# Patient Record
Sex: Female | Born: 1960 | Race: White | Hispanic: No | Marital: Married | Smoking: Never smoker
Health system: Southern US, Community
[De-identification: ages and names within clinical notes are randomized; demographics above are authoritative.]

## PROBLEM LIST (undated history)

## (undated) DIAGNOSIS — I890 Lymphedema, not elsewhere classified: Secondary | ICD-10-CM

## (undated) DIAGNOSIS — F329 Major depressive disorder, single episode, unspecified: Secondary | ICD-10-CM

## (undated) DIAGNOSIS — F419 Anxiety disorder, unspecified: Secondary | ICD-10-CM

## (undated) DIAGNOSIS — M542 Cervicalgia: Secondary | ICD-10-CM

## (undated) DIAGNOSIS — F5104 Psychophysiologic insomnia: Secondary | ICD-10-CM

## (undated) DIAGNOSIS — S0300XA Dislocation of jaw, unspecified side, initial encounter: Secondary | ICD-10-CM

## (undated) DIAGNOSIS — H409 Unspecified glaucoma: Secondary | ICD-10-CM

## (undated) DIAGNOSIS — F32A Depression, unspecified: Secondary | ICD-10-CM

## (undated) HISTORY — DX: Dislocation of jaw, unspecified side, initial encounter: S03.00XA

## (undated) HISTORY — DX: Lymphedema, not elsewhere classified: I89.0

## (undated) HISTORY — DX: Anxiety disorder, unspecified: F41.9

## (undated) HISTORY — PX: WISDOM TOOTH EXTRACTION: SHX21

## (undated) HISTORY — PX: BREAST LUMPECTOMY: SHX2

## (undated) HISTORY — DX: Cervicalgia: M54.2

## (undated) HISTORY — DX: Unspecified glaucoma: H40.9

## (undated) HISTORY — DX: Psychophysiologic insomnia: F51.04

---

## 2002-02-10 HISTORY — PX: BREAST BIOPSY: SHX20

## 2010-04-25 HISTORY — PX: MYOMECTOMY: SHX85

## 2013-11-08 DIAGNOSIS — F419 Anxiety disorder, unspecified: Secondary | ICD-10-CM

## 2013-11-08 HISTORY — DX: Anxiety disorder, unspecified: F41.9

## 2015-01-30 ENCOUNTER — Other Ambulatory Visit: Payer: Self-pay

## 2015-01-30 DIAGNOSIS — Z1231 Encounter for screening mammogram for malignant neoplasm of breast: Secondary | ICD-10-CM

## 2015-02-15 ENCOUNTER — Ambulatory Visit
Admission: RE | Admit: 2015-02-15 | Discharge: 2015-02-15 | Disposition: A | Discharge status: Home / Self Care | Payer: 59 | Source: Ambulatory Visit

## 2015-02-15 DIAGNOSIS — Z1231 Encounter for screening mammogram for malignant neoplasm of breast: Secondary | ICD-10-CM

## 2015-04-11 DIAGNOSIS — S0300XA Dislocation of jaw, unspecified side, initial encounter: Secondary | ICD-10-CM

## 2015-04-11 HISTORY — DX: Dislocation of jaw, unspecified side, initial encounter: S03.00XA

## 2015-12-05 ENCOUNTER — Other Ambulatory Visit: Payer: Self-pay | Admitting: Physician Assistant

## 2015-12-05 DIAGNOSIS — N644 Mastodynia: Secondary | ICD-10-CM

## 2015-12-10 ENCOUNTER — Ambulatory Visit
Admission: RE | Admit: 2015-12-10 | Discharge: 2015-12-10 | Disposition: A | Discharge status: Home / Self Care | Payer: 59 | Source: Ambulatory Visit | Attending: Physician Assistant | Admitting: Physician Assistant

## 2015-12-10 DIAGNOSIS — N644 Mastodynia: Secondary | ICD-10-CM

## 2016-04-06 ENCOUNTER — Encounter (HOSPITAL_COMMUNITY): Payer: Self-pay | Admitting: Emergency Medicine

## 2016-04-06 ENCOUNTER — Emergency Department (HOSPITAL_COMMUNITY)
Admission: EM | Admit: 2016-04-06 | Discharge: 2016-04-06 | Disposition: A | Discharge status: Home / Self Care | Payer: 59 | Attending: Emergency Medicine | Admitting: Emergency Medicine

## 2016-04-06 DIAGNOSIS — T43011A Poisoning by tricyclic antidepressants, accidental (unintentional), initial encounter: Secondary | ICD-10-CM | POA: Diagnosis not present

## 2016-04-06 DIAGNOSIS — T50901A Poisoning by unspecified drugs, medicaments and biological substances, accidental (unintentional), initial encounter: Secondary | ICD-10-CM

## 2016-04-06 HISTORY — DX: Depression, unspecified: F32.A

## 2016-04-06 HISTORY — DX: Major depressive disorder, single episode, unspecified: F32.9

## 2016-04-06 LAB — COMPREHENSIVE METABOLIC PANEL
ALBUMIN: 3.8 g/dL (ref 3.5–5.0)
ALT: 18 U/L (ref 14–54)
AST: 21 U/L (ref 15–41)
Alkaline Phosphatase: 86 U/L (ref 38–126)
Anion gap: 9 (ref 5–15)
BUN: 18 mg/dL (ref 6–20)
CHLORIDE: 103 mmol/L (ref 101–111)
CO2: 26 mmol/L (ref 22–32)
CREATININE: 0.82 mg/dL (ref 0.44–1.00)
Calcium: 9 mg/dL (ref 8.9–10.3)
GFR calc non Af Amer: >60 mL/min (ref >60)
GLUCOSE: 159 mg/dL — AB (ref 65–99)
Potassium: 4 mmol/L (ref 3.5–5.1)
SODIUM: 138 mmol/L (ref 135–145)
Total Bilirubin: 0.5 mg/dL (ref 0.3–1.2)
Total Protein: 6.4 g/dL — ABNORMAL LOW (ref 6.5–8.1)

## 2016-04-06 LAB — CBC
HCT: 35.5 % — ABNORMAL LOW (ref 36.0–46.0)
Hemoglobin: 11.9 g/dL — ABNORMAL LOW (ref 12.0–15.0)
MCH: 30.5 pg (ref 26.0–34.0)
MCHC: 33.5 g/dL (ref 30.0–36.0)
MCV: 91 fL (ref 78.0–100.0)
PLATELETS: 206 10*3/uL (ref 150–400)
RBC: 3.9 MIL/uL (ref 3.87–5.11)
RDW: 12.9 % (ref 11.5–15.5)
WBC: 5.7 10*3/uL (ref 4.0–10.5)

## 2016-04-06 NOTE — Discharge Instructions (Signed)
It was our pleasure to provide your ER care today - we hope that you feel better.  Rest. Drink plenty of fluids.  As that medication may cause drowsiness, no driving for the next few hours, or whenever taking a medication that causes drowsiness.   Return to ER if worse, new symptoms, fainting, other concern.

## 2016-04-06 NOTE — ED Notes (Signed)
Patient is drinking water.

## 2016-04-06 NOTE — ED Provider Notes (Signed)
[facility]-EMERGENCY DEPT Provider Note   CSN: [id number] Arrival date & time: 04/06/16  1154  By signing my name below, I, Linna DarnerRussell Turner, attest that this documentation has been prepared under the direction and in the presence of physician practitioner, Cathren LaineKevin Akila Batta, MD. Electronically Signed: Linna Darnerussell Turner, Scribe. 04/06/2016. 12:23 PM.  History   Chief Complaint Chief Complaint  Patient presents with  . Medication Reaction    The history is provided by the patient. No language interpreter was used.     HPI Comments: Valerie Holt is a 56 y.o. female who presents to the Emergency Department complaining of a potential medication reaction beginning this morning. Pt states she has taken 3 Effexor pills every morning for 20 years and accidentally took 3 Doxepin pills this morning instead. She notes she uses 1 Doxepin pill every night to help her sleep. Pt did not take her Effexor PTA today. Pt has used Effexor for 20 years and Doxepin for about 1 year. She was evaluated at an Urgent Care PTA today and was advised to come to the ED because of her consistently decreasing blood pressure. She notes she has felt lightheaded upon standing since taking the Doxepin this morning. Pt has been eating and drinking normally. She denies nausea, vomiting, CP, SOB, LOC, or any other associated symptoms.  Past Medical History:  Diagnosis Date  . Depression     There are no active problems to display for this patient.   History reviewed. No pertinent surgical history.  OB History    No data available       Home Medications    Prior to Admission medications   Not on File    Family History History reviewed. No pertinent family history.  Social History Social History  Substance Use Topics  . Smoking status: Never Smoker  . Smokeless tobacco: Never Used  . Alcohol use No     Allergies   Penicillins   Review of Systems Review of Systems  Constitutional: Negative for appetite change.    Respiratory: Negative for shortness of breath.   Cardiovascular: Negative for chest pain.  Gastrointestinal: Negative for nausea and vomiting.  Neurological: Positive for light-headedness. Negative for syncope.  All other systems reviewed and are negative.    Physical Exam Updated Vital Signs BP 110/74 (BP Location: Left Arm)   Pulse 89   Temp 97.8 F (36.6 C) (Oral)   Resp 12   Ht 5\' 6"  (1.676 m)   Wt 204 lb 1 oz (92.6 kg)   SpO2 98%   BMI 32.94 kg/m   Physical Exam  Constitutional: She is oriented to person, place, and time. She appears well-developed and well-nourished. No distress.  HENT:  Head: Normocephalic and atraumatic.  Eyes: Conjunctivae and EOM are normal.  Neck: Neck supple. No tracheal deviation present.  Cardiovascular: Normal rate.   Pulmonary/Chest: Effort normal. No respiratory distress.  Musculoskeletal: Normal range of motion.  Neurological: She is alert and oriented to person, place, and time.  Skin: Skin is warm and dry.  Psychiatric: She has a normal mood and affect. Her behavior is normal.  Nursing note and vitals reviewed.    ED Treatments / Results  Labs (all labs ordered are listed, but only abnormal results are displayed) Results for orders placed or performed during the hospital encounter of 04/06/16  Comprehensive metabolic panel  Result Value Ref Range   Sodium 138 135 - 145 mmol/L   Potassium 4.0 3.5 - 5.1 mmol/L   Chloride 103 101 -  111 mmol/L   CO2 26 22 - 32 mmol/L   Glucose, Bld 159 (H) 65 - 99 mg/dL   BUN 18 6 - 20 mg/dL   Creatinine, Ser 1.61 0.44 - 1.00 mg/dL   Calcium 9.0 8.9 - 09.6 mg/dL   Total Protein 6.4 (L) 6.5 - 8.1 g/dL   Albumin 3.8 3.5 - 5.0 g/dL   AST 21 15 - 41 U/L   ALT 18 14 - 54 U/L   Alkaline Phosphatase 86 38 - 126 U/L   Total Bilirubin 0.5 0.3 - 1.2 mg/dL   GFR calc non Af Amer >60 >60 mL/min   GFR calc Af Amer >60 >60 mL/min   Anion gap 9 5 - 15  CBC  Result Value Ref Range   WBC 5.7 4.0 - 10.5  K/uL   RBC 3.90 3.87 - 5.11 MIL/uL   Hemoglobin 11.9 (L) 12.0 - 15.0 g/dL   HCT 04.5 (L) 40.9 - 81.1 %   MCV 91.0 78.0 - 100.0 fL   MCH 30.5 26.0 - 34.0 pg   MCHC 33.5 30.0 - 36.0 g/dL   RDW 91.4 78.2 - 95.6 %   Platelets 206 150 - 400 K/uL    EKG  EKG Interpretation  Date/Time:  Sunday April 06 2016 13:01:24 EST Ventricular Rate:  78 PR Interval:    QRS Duration: 97 QT Interval:  400 QTC Calculation: 456 R Axis:   55 Text Interpretation:  Sinus rhythm No previous tracing Confirmed by Denton Lank  MD, Caryn Bee ([id number]) on 04/06/2016 1:11:05 PM       Radiology No results found.  Procedures Procedures (including critical care time)  DIAGNOSTIC STUDIES: Oxygen Saturation is 98% on RA, normal by my interpretation.    COORDINATION OF CARE: 12:26 PM Discussed treatment plan with pt at bedside and pt agreed to plan.  Medications Ordered in ED Medications - No data to display   Initial Impression / Assessment and Plan / ED Course  I have reviewed the triage vital signs and the nursing notes.  Pertinent labs & imaging results that were available during my care of the patient were reviewed by me and considered in my medical decision making (see chart for details).  Cardiac monitor. Nsr.  Pt approx 4 hours post accidentally taking 2 extra doxepin.  Exam unremarkable.   Patient currently appears stable for d/c.     Final Clinical Impressions(s) / ED Diagnoses   Final diagnoses:  None    New Prescriptions New Prescriptions   No medications on file   I personally performed the services described in this documentation, which was scribed in my presence. The recorded information has been reviewed and considered. Cathren Laine, MD    Cathren Laine, MD 04/06/16 [phone]

## 2016-04-06 NOTE — ED Triage Notes (Signed)
Pt sts accidentally mixed up her medications this am and took 3 doxipine; pt sts feels sleepy

## 2016-04-09 ENCOUNTER — Other Ambulatory Visit: Payer: Self-pay | Admitting: Physician Assistant

## 2016-04-09 DIAGNOSIS — Z1231 Encounter for screening mammogram for malignant neoplasm of breast: Secondary | ICD-10-CM

## 2016-04-29 ENCOUNTER — Encounter: Payer: Self-pay | Admitting: Radiology

## 2016-04-29 ENCOUNTER — Ambulatory Visit
Admission: RE | Admit: 2016-04-29 | Discharge: 2016-04-29 | Disposition: A | Discharge status: Home / Self Care | Payer: 59 | Source: Ambulatory Visit | Attending: Physician Assistant | Admitting: Physician Assistant

## 2016-04-29 DIAGNOSIS — Z1231 Encounter for screening mammogram for malignant neoplasm of breast: Secondary | ICD-10-CM

## 2016-12-12 ENCOUNTER — Other Ambulatory Visit: Payer: Self-pay

## 2016-12-12 DIAGNOSIS — M7989 Other specified soft tissue disorders: Secondary | ICD-10-CM

## 2016-12-23 ENCOUNTER — Encounter: Payer: Self-pay | Admitting: Surgery

## 2017-01-26 ENCOUNTER — Encounter: Payer: Self-pay | Admitting: Surgery

## 2017-01-26 ENCOUNTER — Ambulatory Visit: Payer: 59 | Admitting: Surgery

## 2017-01-26 ENCOUNTER — Ambulatory Visit (HOSPITAL_COMMUNITY)
Admission: RE | Admit: 2017-01-26 | Discharge: 2017-01-26 | Disposition: A | Discharge status: Home / Self Care | Payer: 59 | Source: Ambulatory Visit | Attending: Surgery | Admitting: Surgery

## 2017-01-26 VITALS — BP 116/81 | HR 85 | Temp 99.3°F | Resp 16 | Ht 66.0 in | Wt 211.0 lb

## 2017-01-26 DIAGNOSIS — I868 Varicose veins of other specified sites: Secondary | ICD-10-CM | POA: Diagnosis not present

## 2017-01-26 DIAGNOSIS — I890 Lymphedema, not elsewhere classified: Secondary | ICD-10-CM

## 2017-01-26 DIAGNOSIS — M7989 Other specified soft tissue disorders: Secondary | ICD-10-CM | POA: Diagnosis not present

## 2017-01-26 NOTE — Progress Notes (Signed)
[facility]  Patient name: Valerie Holt MRN: [id number] DOB: 09/16/1960 Sex: female   REQUESTING PROVIDER:    Dr. Tereso Newcomerouillard   REASON FOR CONSULT:    Leg swelling  HISTORY OF PRESENT ILLNESS:   Valerie Holt is a 56 y.o. female, who is referred for evaluation of leg swelling.  The patient was an main this summer and was told that she had a lymphedema and that she should have this evaluated [place].  Patient has had chronic leg swelling.  She states that it has gotten a little worse.  She does not have a history of ulcers.  She does not wear compression stockings.  PAST MEDICAL HISTORY    Past Medical History:  Diagnosis Date  . Anxiety 11/08/2013  . Depression   . Depression   . Glaucoma   . Neck pain   . Psychophysiological insomnia   . 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 Diabetes Mother   . Hypertension Mother   . Migraines Mother   . Diabetes Father   . Hypertension Father   . Heart disease Brother   . Coronary artery disease Paternal Grandfather     SOCIAL HISTORY:   Social History   Socioeconomic History  . Marital status: Married    Spouse name: Not on file  . Number of children: Not on file  . Years of education: Not on file  . Highest education level: Not on file  Social Needs  . Financial resource strain: Not on file  . Food insecurity - worry: Not on file  . Food insecurity - inability: Not on file  . Transportation needs - medical: Not on file  . Transportation needs - non-medical: Not on file  Occupational History  . Not on file  Tobacco Use  . Smoking status: Never Smoker  . Smokeless tobacco: Never Used  Substance and Sexual Activity  . Alcohol use: No  . Drug use: No  . Sexual activity: Not on file  Other Topics Concern  . Not on file  Social History  Narrative  . Not on file    ALLERGIES:    Allergies  Allergen Reactions  . Penicillins     CURRENT MEDICATIONS:    Current Outpatient Medications  Medication Sig Dispense Refill  . clonazePAM (KLONOPIN) 0.5 MG tablet Take 1 tablet as needed by mouth.    . doxepin (SINEQUAN) 75 MG capsule Take 1 capsule once by mouth.    . hydrOXYzine (ATARAX/VISTARIL) 25 MG tablet Take 25 mg 1 day or 1 dose by mouth.    . Travoprost, BAK Free, (TRAVATAN Z) 0.004 % SOLN ophthalmic solution 1 drop at bedtime.    Marland Kitchen. venlafaxine (EFFEXOR) 75 MG tablet Take 75 mg by mouth 3 (three) times daily with meals.     No current facility-administered medications for this visit.     REVIEW OF SYSTEMS:   [X]  denotes positive finding, [ ]  denotes negative finding Cardiac  Comments:  Chest pain or chest pressure:    Shortness of breath upon exertion:    Short of breath when lying flat:    Irregular heart rhythm:        Vascular    Pain in calf, thigh, or hip brought on by ambulation: x   Pain in feet at night that wakes you up from your sleep:  Blood clot in your veins:    Leg swelling:  x       Pulmonary    Oxygen at home:    Productive cough:     Wheezing:         Neurologic    Sudden weakness in arms or legs:     Sudden numbness in arms or legs:     Sudden onset of difficulty speaking or slurred speech:    Temporary loss of vision in one eye:     Problems with dizziness:  x       Gastrointestinal    Blood in stool:      Vomited blood:         Genitourinary    Burning when urinating:     Blood in urine:        Psychiatric    Major depression:         Hematologic    Bleeding problems:    Problems with blood clotting too easily:        Skin    Rashes or ulcers:        Constitutional    Fever or chills:     PHYSICAL EXAM:   Vitals:   01/26/17 1156  BP: 116/81  Pulse: 85  Resp: 16  Temp: 99.3 F (37.4 C)  TempSrc: Oral  SpO2: 96%  Weight: 211 lb (95.7 kg)  Height:  5\' 6"  (1.676 m)    GENERAL: The patient is a well-nourished female, in no acute distress. The vital signs are documented above. CARDIAC: There is a regular rate and rhythm.  VASCULAR: bilateral LE non-pitting edema PULMONARY: Nonlabored respirations ABDOMEN: Soft and non-tender with normal pitched bowel sounds.  MUSCULOSKELETAL: There are no major deformities or cyanosis. NEUROLOGIC: No focal weakness or paresthesias are detected. SKIN: There are no ulcers or rashes noted. PSYCHIATRIC: The patient has a normal affect.  STUDIES:   I have ordered and reviewed her venous insufficiency study.  She does not have any significant venous reflux in bilateral lower extremity  ASSESSMENT and PLAN   Lymphedema: I discussed the management strategy with the patient.  I have recommended that she start wearing compression stockings.  I told her that she could trial either of the knee-high or thigh-high.  I would recommend starting out at the 20-30 level of compression, with the possibility of increasing this at a later date.  We also talked about leg elevation and possibly a lymphedema pump.  Before making a recommendation on a lymphedema pump, I am going to get her into see a lymphedema specialist who I would like to help facilitate getting her pump.  We discussed the importance of these measures in order to avoid long-term complications.  She understands and will do her best to comply.  She will contact me if any new issues arise, otherwise I will see her back on an as-needed basis.   Durene CalWells Daundre Biel, MD [facility] Tel [phone] Pager [phone]

## 2017-03-20 ENCOUNTER — Other Ambulatory Visit: Payer: Self-pay | Admitting: Physician Assistant

## 2017-03-20 DIAGNOSIS — Z1231 Encounter for screening mammogram for malignant neoplasm of breast: Secondary | ICD-10-CM

## 2017-05-04 ENCOUNTER — Ambulatory Visit
Admission: RE | Admit: 2017-05-04 | Discharge: 2017-05-04 | Disposition: A | Discharge status: Home / Self Care | Payer: 59 | Source: Ambulatory Visit | Attending: Physician Assistant | Admitting: Physician Assistant

## 2017-05-04 DIAGNOSIS — Z1231 Encounter for screening mammogram for malignant neoplasm of breast: Secondary | ICD-10-CM

## 2018-02-09 ENCOUNTER — Other Ambulatory Visit: Payer: Self-pay | Admitting: Family Medicine

## 2018-02-09 DIAGNOSIS — N6099 Unspecified benign mammary dysplasia of unspecified breast: Secondary | ICD-10-CM

## 2018-02-15 ENCOUNTER — Encounter: Payer: Self-pay | Admitting: Neurology

## 2018-02-17 ENCOUNTER — Ambulatory Visit: Payer: 59 | Admitting: Neurology

## 2018-02-17 ENCOUNTER — Encounter: Payer: Self-pay | Admitting: Neurology

## 2018-02-17 VITALS — BP 150/103 | HR 91 | Ht 66.0 in | Wt 215.0 lb

## 2018-02-17 DIAGNOSIS — F5104 Psychophysiologic insomnia: Secondary | ICD-10-CM

## 2018-02-17 DIAGNOSIS — Z658 Other specified problems related to psychosocial circumstances: Secondary | ICD-10-CM | POA: Diagnosis not present

## 2018-02-17 DIAGNOSIS — I890 Lymphedema, not elsewhere classified: Secondary | ICD-10-CM | POA: Diagnosis not present

## 2018-02-17 NOTE — Progress Notes (Signed)
SLEEP MEDICINE CLINIC   Provider:  Melvyn Novas, MD   Primary Care Physician:  [facility]   Referring Provider: Dr Gaspar Skeeters, MD   Chief Complaint  Patient presents with  . New Patient (Initial Visit)    pt alone, rm 10. pt states that for 20 years she has struggled with falling asleep. pt states that once she gets to sleep she is able sleep 6-8 hrs. pt currently using medication to help with falling asleep. pt unsure if she snores in sleep. pt doesnt complain with daytime sleepiness and never had sleep study     HPI:  Valerie Holt is a 58 y.o. female patient, seen here  in a referral from Dr. Gaspar Skeeters for chronic insomnia. She also has Lymphoedema. She had a vertical split tooth, the root penetrated the maxiallary sinus. She had the  tooth extracted and the sinus had to be repaired.   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  She is treated with Effexor has been for many years and feels that this has kept her on an even keel.  She does relate the onset of insomnia to the onset of depression.  She is also treated for some seasonal allergies but her insomnia has never been evaluated in an attended sleep study, and there are several risk factors now that should be taken into consideration.    She has also failed multiple medications that were meant to treat the insomnia including Seroquel which caused her to have nightmares, trazodone, Remeron, Klonopin and Belsomra.  Current medication was doxepin but it did not help at all.  She is planning to return to a low dose of Seroquel.  She has also tried  25 mg once daily of hydroxyzine.   Sleep habits are as follows: suppertime is at 4.30- 5 PM. Watching TV after dinner, plays candy crush, and never get's tired. She goes to bed at 1 AM, needs  medication to sleep. She rises at 10 AM. She averages 5-6 hours of sleep. No Tv no electronic in bed. Nocturia -none. Sleeps on her side on one pillow and one between her knees.   Sleep medical history and Family sleep history:  Sinus maxillaris injury, lymphoedema  After fibroid surgery, Insomnia, depression. No sleep disorders in the family- has one brother.    Social history:  One adopted child, adopted at age 51, "drug baby", ADHD, Bipolar.  Married.Non smoker, non drinker, caffeine in form of chocolate.  Employment - none , volunteer work.    Review of Systems: Out of a complete 14 system review, the patient complains of only the following symptoms, and all other reviewed systems are negative. Snoring.   Epworth Sleepiness score endorsed at 0 , Fatigue severity score 10/ 63 points  , depression score: n/a    Social History   Socioeconomic History  . Marital status: Married    Spouse name: Not on file  . Number of children: Not on file  . Years of education: Not on file  . Highest education level: Not on file  Occupational History  . Not on file  Social Needs  . Financial resource strain: Not on file  . Food insecurity:    Worry: Not on file    Inability: Not on file  . Transportation needs:    Medical: Not on file  Non-medical: Not on file  Tobacco Use  . Smoking status: Never Smoker  . Smokeless tobacco: Never Used  Substance and Sexual Activity  . Alcohol use: No  . Drug use: No  . Sexual activity: Not on file  Lifestyle  . Physical activity:    Days per week: Not on file    Minutes per session: Not on file  . Stress: Not on file  Relationships  . Social connections:    Talks on phone: Not on file    Gets together: Not on file    Attends religious service: Not on file    Active member of club or organization: Not on file    Attends meetings of clubs or organizations: Not on file    Relationship status: Not on file  . Intimate partner violence:    Fear  of current or ex partner: Not on file    Emotionally abused: Not on file    Physically abused: Not on file    Forced sexual activity: Not on file  Other Topics Concern  . Not on file  Social History Narrative  . Not on file    Family History  Problem Relation Age of Onset  . Diabetes Mother   . Hypertension Mother   . Migraines Mother   . Diabetes Father   . Hypertension Father   . Heart disease Brother   . Coronary artery disease Paternal Grandfather   . Breast cancer Neg Hx     Past Medical History:  Diagnosis Date  . Anxiety 11/08/2013  . Depression   . Depression   . Glaucoma   . Glaucoma   . Lymph edema   . Neck pain   . Psychophysiological insomnia   . 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 BREAST BIOPSY Left 2004  . BREAST LUMPECTOMY Left   . MYOMECTOMY  04/25/2010  . WISDOM TOOTH EXTRACTION      Current Outpatient Medications  Medication Sig Dispense Refill  . clonazePAM (KLONOPIN) 0.5 MG tablet Take 1 tablet as needed by mouth.    . doxepin (SINEQUAN) 75 MG capsule Take 1 capsule once by mouth.    . hydrOXYzine (ATARAX/VISTARIL) 25 MG tablet Take 25 mg 1 day or 1 dose by mouth.    . Travoprost, BAK Free, (TRAVATAN Z) 0.004 % SOLN ophthalmic solution 1 drop at bedtime.    Marland Kitchen venlafaxine (EFFEXOR) 75 MG tablet Take 75 mg by mouth 3 (three) times daily with meals.     No current facility-administered medications for this visit.     Allergies as of 02/17/2018 - Review Complete 02/17/2018  Allergen Reaction Noted  . Penicillins  04/06/2016    Vitals: BP (!) 150/103   Pulse 91   Ht 5\' 6"  (1.676 m)   Wt 215 lb (97.5 kg)   BMI 34.70 kg/m  Last Weight:  Wt Readings from Last 1 Encounters:  02/17/18 215 lb (97.5 kg)   PFY:TWKM mass index is 34.7 kg/m.     Last Height:   Ht Readings from Last 1 Encounters:  02/17/18 5\' 6"  (1.676 m)    Physical exam:  General: The patient  is awake, alert and appears not in acute distress. The patient is well groomed. Head: Normocephalic, atraumatic. Neck is supple. Mallampati 3  neck circumference: 14. 75" . Nasal airflow patent, Retrognathia is not seen.  Cardiovascular:  Regular rate and rhythm, without murmurs or carotid bruit, and  without distended neck veins. Respiratory: Lungs are clear to auscultation. Skin:  Without evidence of edema, or rash Trunk: BMI is 34.7 . The patient's posture is erect.   Neurologic exam : The patient is awake and alert, oriented to place and time.     Attention span & concentration ability appears normal.  Speech is fluent,  without  dysarthria, dysphonia or aphasia.  Mood and affect are appropriate.  Cranial nerves: Pupils are equal and briskly reactive to light. Funduscopic exam without  evidence of pallor or edema. Extraocular movements in vertical and horizontal planes intact and without nystagmus. Visual fields by finger perimetry are intact. Hearing to finger rub intact.  Facial sensation intact to fine touch. Facial motor strength is symmetric and tongue and uvula move midline. Shoulder shrug was symmetrical.   Motor exam:   Normal tone, muscle bulk and symmetric strength in all extremities. Her legs are very puffy.  Sensory:  Fine touch, pinprick and vibration were tested in all extremities. Proprioception tested in the upper extremities was normal. Coordination: Rapid alternating movements in the fingers/hands was normal. Finger-to-nose maneuver  normal without evidence of ataxia, dysmetria or tremor. Gait and station: Patient walks without assistive device  Deep tendon reflexes: in the  upper extremities are symmetric and intact.    Assessment:  After physical and neurologic examination, review of laboratory studies,  Personal review of imaging studies, reports of other /same  Imaging studies, results of polysomnography and / or neurophysiology testing and pre-existing records as  far as provided in visit., my assessment is :  There are several risk factors present that could indicate that the patient has obstructive sleep apnea one is her elevated body mass index but the fluid retention related to the lymphedema may account for that.  The upper airway is not very narrow Mallampati grade 3, there is no retrognathia noted, there is also no evidence of acid reflux related irritation of the soft palate, edema of the soft palate.  Neck circumference in normal range.  Blood pressure today was elevated.  I will order a sleep study as a home sleep test for this patient to see if there is any organic cause that I could find.  She does not have classic restless legs with the irresistible urge to move.  The causes of her insomnia were attributed to rather traumatic events related to the life choices of her adopted daughter.  Her voice has continued well into the daughter's adult age, drug addiction, multiple unwanted pregnancies etc. Part may be best addressed by cognitive behavioral therapy and counseling.  I would not be opposed to the patient taking sleep aids and Seroquel at a dose as little as 25 mg may actually help her to sleep I understand that she has taken doses of 200 mg at night. She remembers not having slept for 2 weeks and ending up hospitalized 20 years ago.    The patient was advised of the nature of the diagnosed disorder , the treatment options and the  risks for general health and wellness arising from not treating the condition.   I spent more than 40 minutes of face to face time with the patient.  Greater than 50% of time was spent in counseling and coordination of care. We have discussed the diagnosis and differential and I answered the patient's questions.    Plan:  Treatment plan and additional workup :  HST -  Rule out apnea. Chronic insomnia is not treated by us, but by  counseling.     Melvyn NovasARMEN Gurneet Matarese, MD 02/17/2018, 11:41 AM  Certified in Neurology by  ABPN Certified in Sleep Medicine by [facility]  [facility] [address], [address] [place], [place] [id number]

## 2018-02-24 ENCOUNTER — Ambulatory Visit
Admission: RE | Admit: 2018-02-24 | Discharge: 2018-02-24 | Disposition: A | Discharge status: Home / Self Care | Payer: 59 | Source: Ambulatory Visit | Attending: Family Medicine | Admitting: Family Medicine

## 2018-02-24 DIAGNOSIS — N6099 Unspecified benign mammary dysplasia of unspecified breast: Secondary | ICD-10-CM

## 2018-02-24 MED ORDER — GADOBUTROL 1 MMOL/ML IV SOLN
10.0000 mL | Freq: Once | INTRAVENOUS | Status: AC | PRN
Start: 2018-02-24 — End: 2018-02-24
  Administered 2018-02-24: 10 mL via INTRAVENOUS

## 2018-03-04 ENCOUNTER — Other Ambulatory Visit: Payer: Self-pay | Admitting: Physician Assistant

## 2018-03-04 ENCOUNTER — Other Ambulatory Visit: Payer: Self-pay | Admitting: Family Medicine

## 2018-03-04 DIAGNOSIS — Z1231 Encounter for screening mammogram for malignant neoplasm of breast: Secondary | ICD-10-CM

## 2018-03-08 ENCOUNTER — Ambulatory Visit (INDEPENDENT_AMBULATORY_CARE_PROVIDER_SITE_OTHER): Payer: 59 | Admitting: Neurology

## 2018-03-08 DIAGNOSIS — G4733 Obstructive sleep apnea (adult) (pediatric): Secondary | ICD-10-CM | POA: Diagnosis not present

## 2018-03-08 DIAGNOSIS — I890 Lymphedema, not elsewhere classified: Secondary | ICD-10-CM

## 2018-03-08 DIAGNOSIS — F5104 Psychophysiologic insomnia: Secondary | ICD-10-CM

## 2018-03-08 DIAGNOSIS — Z658 Other specified problems related to psychosocial circumstances: Secondary | ICD-10-CM

## 2018-03-16 ENCOUNTER — Telehealth: Payer: Self-pay | Admitting: Neurology

## 2018-03-16 NOTE — Procedures (Signed)
[facility] @[facility] [address] [place], [place] [address] NAME:   Valerie Holt                                                                             DOB: [birth year] MEDICAL RECORD No:   [id number]                                                      DOS:  03/08/2018 REFERRING PHYSICIAN: Talbert Nan, MD STUDY PERFORMED: Home Sleep Test on Watch Pat HISTORY: Valerie Holt is seen on 02-17-2018 in a referral from Dr.Eskir for reported chronic insomnia.  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  She is treated with Effexor has been for many years and feels that this has kept her on an even keel.  She does relate the onset of insomnia to the onset of depression.  She is also treated for some seasonal allergies but her insomnia has never been evaluated in an attended sleep study, and there are several risk factors now that should be taken into consideration.  She has also failed multiple medications that were meant to treat the insomnia including Seroquel which caused her to have nightmares, Trazodone, Remeron, Klonopin and Belsomra. Most current medication was doxepin but it did not help at all.  She is planning to return to a low dose of Seroquel.  She has also tried and failed 25 mg once daily of hydroxyzine. She also has Lymphoedema. She had a vertically split tooth, the root reportedly penetrated the maxillary sinus. She had the tooth extracted and the sinus "had to be repaired". NO EDS ! 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 Watch pat data note a normal length of overall sleep time of 7.5 hours.  RECOMMENDATION: In spite of normal heart rate variability and lack of hypoxia, this type of apnea will most likely respond to CPAP. Order auto CPAP 5-15 cm water, 3 cm EPR, heated humidity and mask of choice.  I certify that I have reviewed the raw data recording prior to the issuance of this report in accordance with the standards of the American Academy of Sleep Medicine (AASM). Melvyn Novas, M.D.   03-14-2018   Medical Director of [facility], accredited by the AASM. Diplomat of the ABPN and ABSM.

## 2018-03-16 NOTE — Telephone Encounter (Signed)
Called the patient called the patient and reviewed her sleep study results with her. Advised her of the findings in the home sleep test. Informed her that it showed she stopped breathing at 15.4 times an hr and in her REM that increased to 35 times an hr. Advised that Dr Vickey Huger recommends the patient start an auto CPAP set at a pressure of 5-15 cm water. Pt states she doesn't want to wear a CPAP. Patient states the main concern and only reason she came was she is most concerned with how long it takes her to fall asleep. Advised the patient that Dr Vickey Huger does the sleep studies to look for organic sleep disorder causes to what causes the patient insomnia and advised that since apnea was found treating the apnea would hopefully help with falling asleep. Offered the patient an apt to discuss this further with Dr Vickey Huger. Patient ask that a copy of this is sent to Donnie Aho, NP her psychiatrist. Advised the patient I would do that for her. Offered the office visit again and the patient states she needs to think on this and that she was disappointed. Informed her to contact us if she would like to move forward with treatment or with an office visit to follow up and discuss the study.

## 2018-03-16 NOTE — Telephone Encounter (Signed)
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  Watch pat data note a normal length of overall sleep time of 7.5  hours.  RECOMMENDATION: In spite of normal heart rate variability and  lack of hypoxia, this type of apnea will most likely respond to  CPAP. Order auto CPAP 5-15 cm water, 3 cm EPR, heated humidity  and mask of choice.   Cc Dr Gaspar Skeeters

## 2018-03-16 NOTE — Addendum Note (Signed)
Addended by: Melvyn Novas on: 03/16/2018 08:24 AM   Modules accepted: Orders

## 2018-05-06 ENCOUNTER — Ambulatory Visit: Payer: 59

## 2018-12-15 ENCOUNTER — Ambulatory Visit: Payer: 59

## 2019-01-27 ENCOUNTER — Ambulatory Visit: Payer: 59

## 2020-04-11 IMAGING — MR MR BILATERAL BREAST WITHOUT AND WITH CONTRAST
8 of 12 series · 30 of 48 positions shown · IV contrast (multihance)
Comparison: Previous exam(s).

CLINICAL DATA: 57-year-old female with personal history of LEFT
breast ADH and excision in 8116.

LABS:  None performed today
EXAM:
BILATERAL BREAST MRI WITH AND WITHOUT CONTRAST
TECHNIQUE: Multiplanar, multisequence MR images of both breasts were obtained
prior to and following the intravenous administration of 10 ml of
MultiHance

[Series 2: t2_tirm_tra ipat (a-p) · axial · 3.0mm · 0.70mm/px · 1 of 74 slices shown]
[im 1/74]
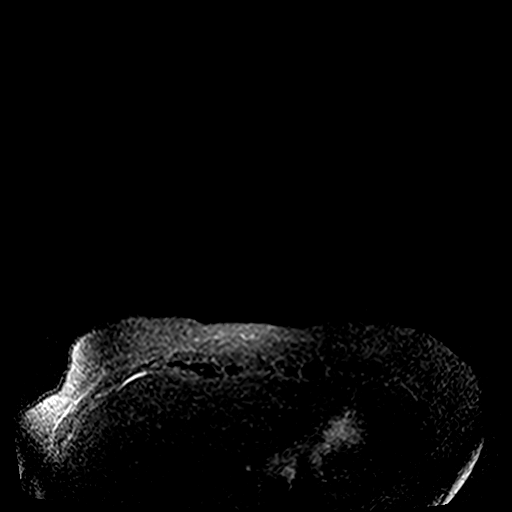

[Series 3: fl3d pre-cm no · axial · non-contrast · 0.9mm · 0.94mm/px · z∈[-118,+97]mm · 5 of 232 slices shown]
[im 1/232]
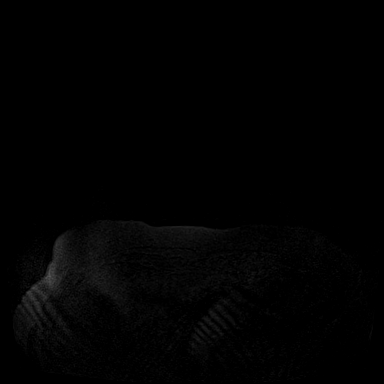
[im 58/232]
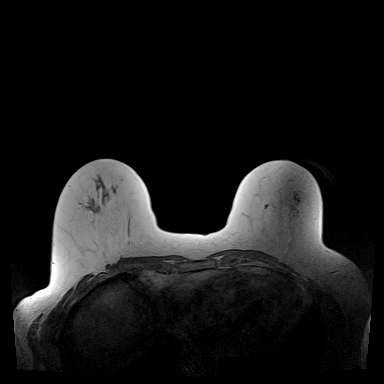
[im 116/232]
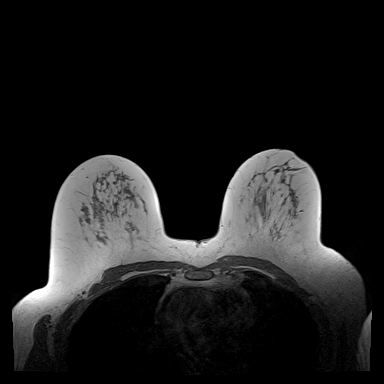
[im 174/232]
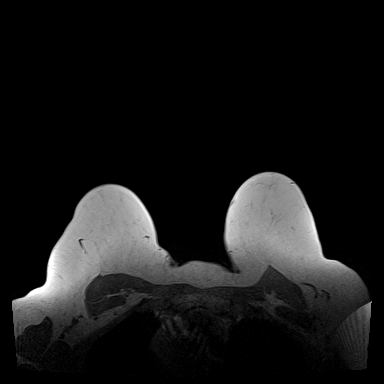
[im 232/232]
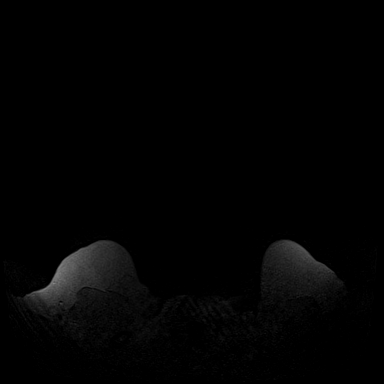

[Series 4: fl3d pre-cm · axial · non-contrast · 0.9mm · 0.87mm/px · z∈[-118,+97]mm · 5 of 240 slices shown]
[im 1/240]
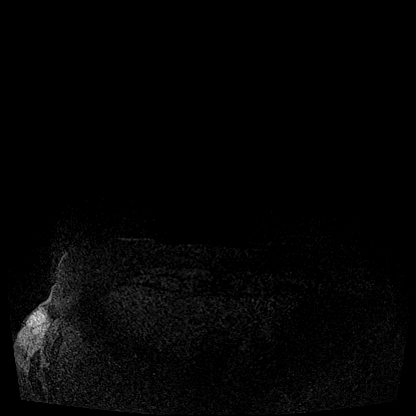
[im 60/240]
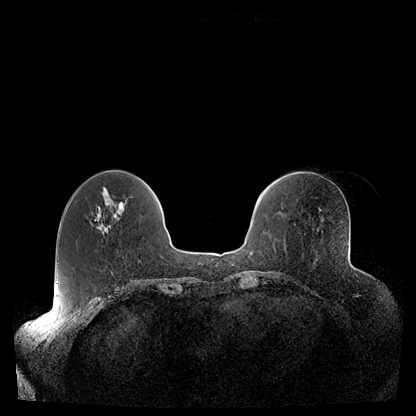
[im 120/240]
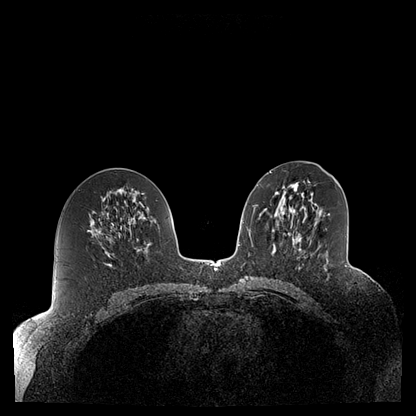
[im 180/240]
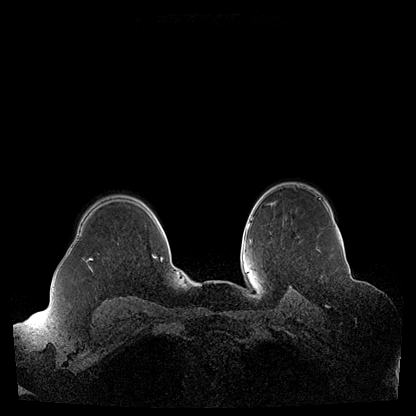
[im 240/240]
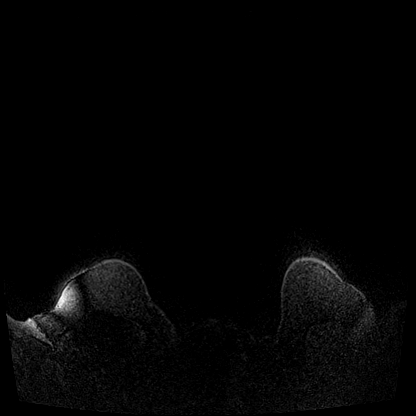

[Series 5: fl3d post-cm 20 · axial · 0.9mm · 0.87mm/px · z∈[-118,+97]mm · 5 of 240 slices shown (1 of 3)]
[im 1/240]
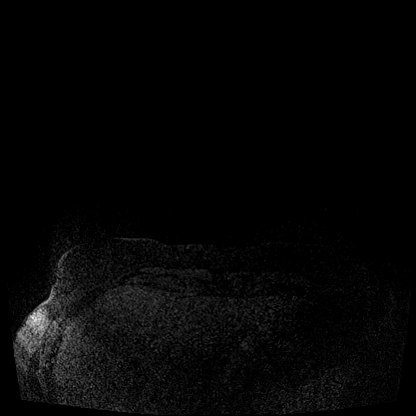
[im 60/240]
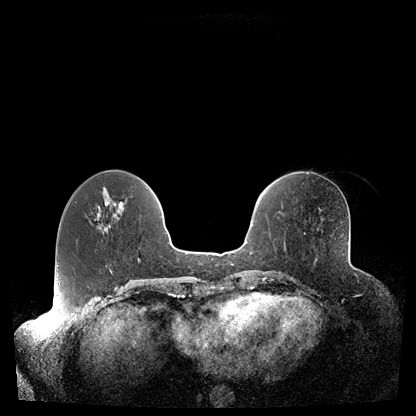
[im 120/240]
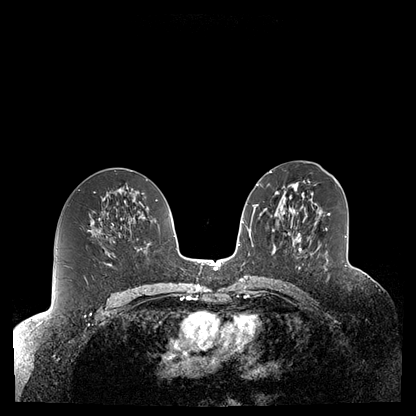
[im 180/240]
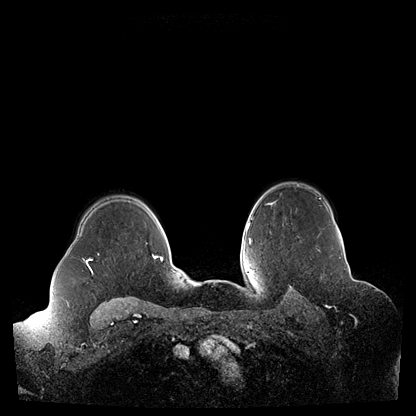
[im 240/240]
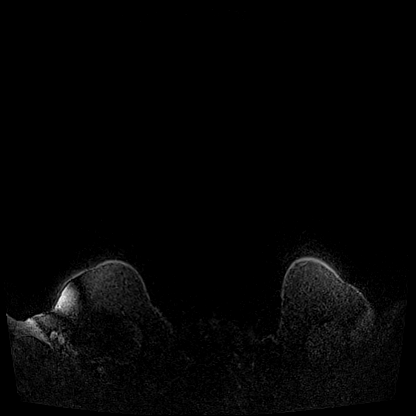

[Series 6: fl3d post-cm 20 · axial · 0.9mm · 0.87mm/px · z∈[-118,+97]mm · 5 of 240 slices shown (2 of 3)]
[im 1/240]
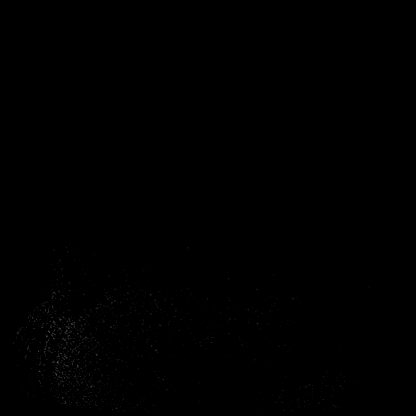
[im 60/240]
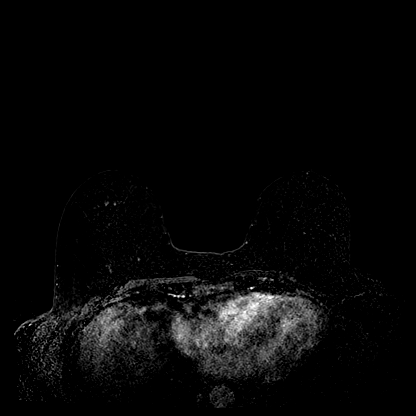
[im 120/240]
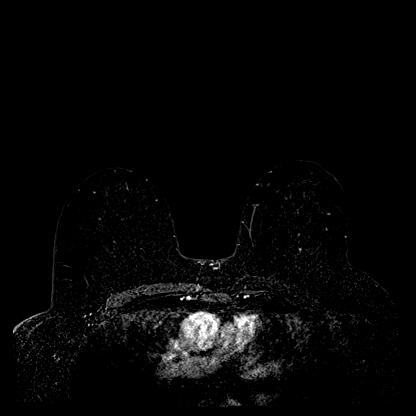
[im 180/240]
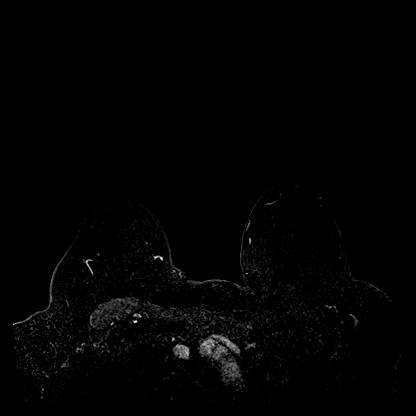
[im 240/240]
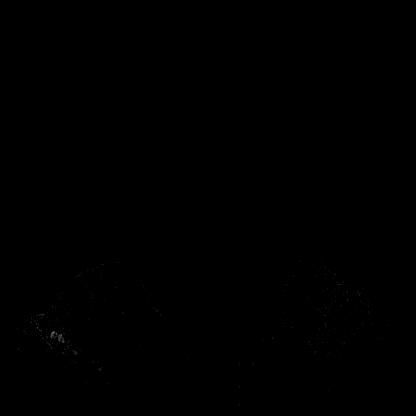

[Series 7: fl3d post-cm 20 · axial · 216.0mm · 0.87mm/px · 1 of 1 slices shown (3 of 3)]
[im 1/1]
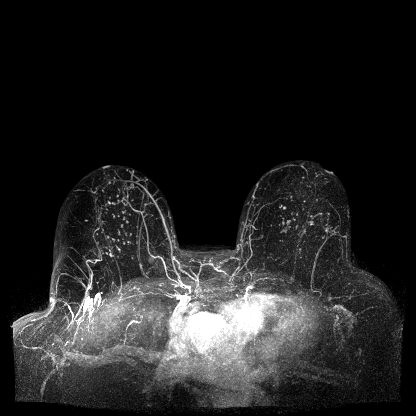

[Series 8: fl3d post-cm 3min · axial · 0.9mm · 0.87mm/px · z∈[-118,+97]mm · 6 of 240 slices shown]
[im 1/240]
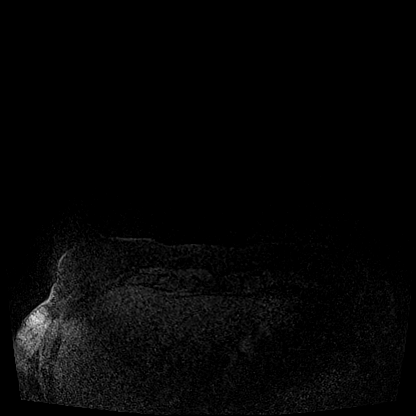
[im 48/240]
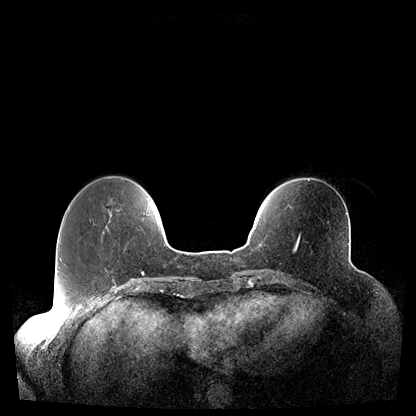
[im 96/240]
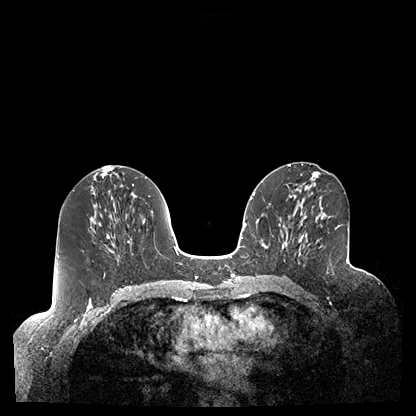
[im 144/240]
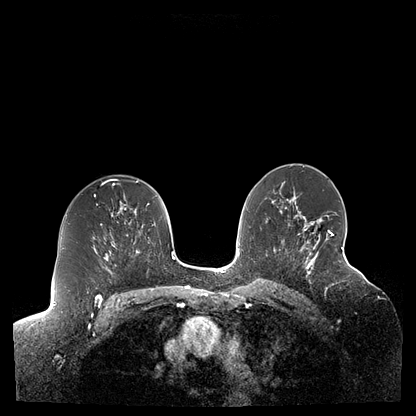
[im 192/240]
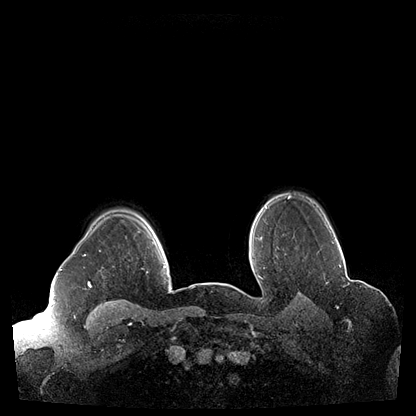
[im 240/240]
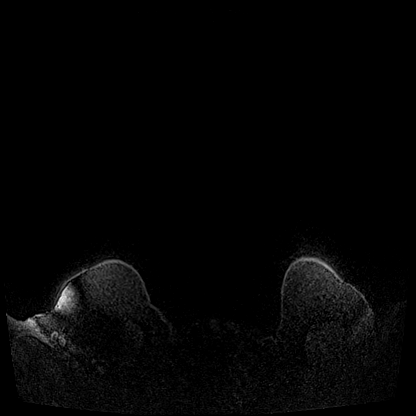

[Series 9: fl3d post-cm 3min_sub · axial · 0.9mm · 0.87mm/px · z∈[-118,-76]mm · 2 of 240 slices shown]
[im 1/240]
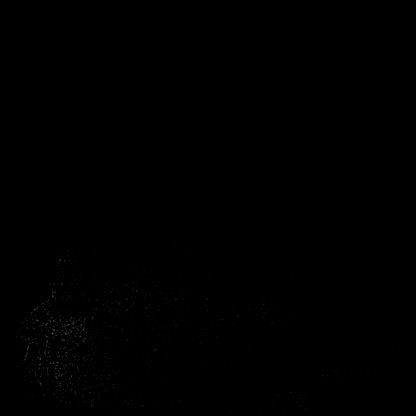
[im 48/240]
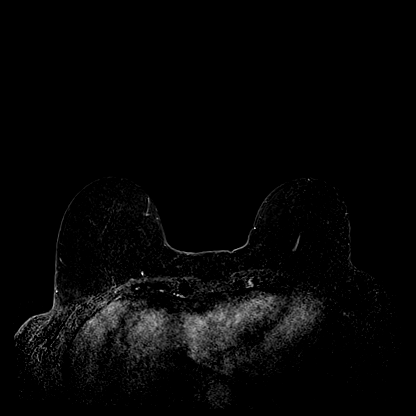

[30 of 48 positions shown; findings below may reference images not displayed]

Three-dimensional MR images were rendered by post-processing of the
original MR data on an independent workstation. The
three-dimensional MR images were interpreted, and findings are
reported in the following complete MRI report for this study. Three
dimensional images were evaluated at the independent [employer]
workstation
FINDINGS: Breast composition: b. Scattered fibroglandular tissue.

Background parenchymal enhancement: Mild

Right breast: No mass or abnormal enhancement. Scattered similar
appearing foci are noted.

Left breast: No mass or abnormal enhancement. Scattered similar
appearing foci are noted. LEFT surgical changes are noted.

Lymph nodes: No abnormal appearing lymph nodes.

Ancillary findings:  None.
IMPRESSION: No MR evidence of breast malignancy.

RECOMMENDATION:
Bilateral screening mammogram in 2 months to resume annual mammogram
schedule.

Consider future screening breast MRI as clinically indicated

BI-RADS CATEGORY  2: Benign.
